# Patient Record
Sex: Female | Born: 2000 | Race: White | Hispanic: No | Marital: Single | Smoking: Never smoker
Health system: Southern US, Community
[De-identification: ages and names within clinical notes are randomized; demographics above are authoritative.]

---

## 2018-02-23 ENCOUNTER — Other Ambulatory Visit: Payer: Self-pay

## 2018-02-23 ENCOUNTER — Encounter: Payer: Self-pay | Admitting: *Deleted

## 2018-02-23 ENCOUNTER — Emergency Department
Admission: EM | Admit: 2018-02-23 | Discharge: 2018-02-23 | Disposition: A | Discharge status: Home / Self Care | Payer: Managed Care, Other (non HMO) | Attending: Emergency Medicine | Admitting: Emergency Medicine

## 2018-02-23 ENCOUNTER — Emergency Department: Payer: Managed Care, Other (non HMO)

## 2018-02-23 DIAGNOSIS — N200 Calculus of kidney: Secondary | ICD-10-CM | POA: Diagnosis not present

## 2018-02-23 DIAGNOSIS — M545 Low back pain: Secondary | ICD-10-CM | POA: Diagnosis present

## 2018-02-23 LAB — URINALYSIS, COMPLETE (UACMP) WITH MICROSCOPIC
BILIRUBIN URINE: NEGATIVE
Bacteria, UA: NONE SEEN
Glucose, UA: NEGATIVE mg/dL
Ketones, ur: 5 mg/dL — AB
LEUKOCYTES UA: NEGATIVE
Nitrite: NEGATIVE
PH: 5 (ref 5.0–8.0)
Protein, ur: 30 mg/dL — AB
Specific Gravity, Urine: 1.024 (ref 1.005–1.030)

## 2018-02-23 LAB — COMPREHENSIVE METABOLIC PANEL
ALBUMIN: 4.1 g/dL (ref 3.5–5.0)
ALK PHOS: 53 U/L (ref 38–126)
ALT: 14 U/L (ref 14–54)
AST: 25 U/L (ref 15–41)
Anion gap: 9 (ref 5–15)
BILIRUBIN TOTAL: 0.4 mg/dL (ref 0.3–1.2)
BUN: 11 mg/dL (ref 6–20)
CO2: 22 mmol/L (ref 22–32)
CREATININE: 0.83 mg/dL (ref 0.44–1.00)
Calcium: 9.3 mg/dL (ref 8.9–10.3)
Chloride: 107 mmol/L (ref 101–111)
GFR calc Af Amer: >60 mL/min (ref >60)
GLUCOSE: 125 mg/dL — AB (ref 65–99)
Potassium: 3.6 mmol/L (ref 3.5–5.1)
Sodium: 138 mmol/L (ref 135–145)
TOTAL PROTEIN: 7.9 g/dL (ref 6.5–8.1)

## 2018-02-23 LAB — CBC
HEMATOCRIT: 38.1 % (ref 35.0–47.0)
HEMOGLOBIN: 12.4 g/dL (ref 12.0–16.0)
MCH: 24.6 pg — ABNORMAL LOW (ref 26.0–34.0)
MCHC: 32.5 g/dL (ref 32.0–36.0)
MCV: 75.7 fL — AB (ref 80.0–100.0)
Platelets: 303 10*3/uL (ref 150–440)
RBC: 5.03 MIL/uL (ref 3.80–5.20)
RDW: 16.4 % — ABNORMAL HIGH (ref 11.5–14.5)
WBC: 9.1 10*3/uL (ref 3.6–11.0)

## 2018-02-23 LAB — LIPASE, BLOOD: LIPASE: 32 U/L (ref 11–51)

## 2018-02-23 LAB — POCT PREGNANCY, URINE: Preg Test, Ur: NEGATIVE

## 2018-02-23 MED ORDER — HYDROCODONE-ACETAMINOPHEN 5-325 MG PO TABS: 1.0000 | ORAL_TABLET | Freq: Four times a day (QID) | ORAL | 0 refills | Status: DC | PRN

## 2018-02-23 MED ORDER — ONDANSETRON 4 MG PO TBDP: 4.0000 mg | ORAL_TABLET | Freq: Four times a day (QID) | ORAL | 0 refills | Status: DC | PRN

## 2018-02-23 MED ORDER — ONDANSETRON HCL 4 MG/2ML IJ SOLN
4.0000 mg | Freq: Once | INTRAMUSCULAR | Status: AC
  Administered 2018-02-23: 4 mg via INTRAVENOUS
  Filled 2018-02-23: qty 2

## 2018-02-23 MED ORDER — MORPHINE SULFATE (PF) 4 MG/ML IV SOLN
4.0000 mg | Freq: Once | INTRAVENOUS | Status: AC
  Administered 2018-02-23: 4 mg via INTRAVENOUS
  Filled 2018-02-23: qty 1

## 2018-02-23 MED ORDER — IBUPROFEN 400 MG PO TABS
600.0000 mg | ORAL_TABLET | ORAL | Status: AC
  Administered 2018-02-23: 600 mg via ORAL
  Filled 2018-02-23: qty 2

## 2018-02-23 NOTE — Discharge Instructions (Signed)
You have been seen in the Emergency Department (ED) today for pain that we believe based on your workup, is caused by kidney stones.  As we have discussed, please drink plenty of fluids.  Please make a follow up appointment with the physician(s) listed elsewhere in this documentation.  You may take pain medication as needed but ONLY as prescribed.    We also recommend that you take over-the-counter ibuprofen regularly according to label instructions over the next 5 days.  Take it with meals to minimize stomach discomfort.  Please see your doctor as soon as possible as stones may take 1-2 weeks to pass and you may require additional care or medications.  Do not drink alcohol, drive or participate in any other potentially dangerous activities while taking opiate pain medication as it may make you sleepy. Do not take this medication with any other sedating medications, either prescription or over-the-counter. If you were prescribed Percocet or Vicodin, do not take these with acetaminophen (Tylenol) as it is already contained within these medications.   This medication is an opiate (or narcotic) pain medication and can be habit forming.  Use it as little as possible to achieve adequate pain control.  Do not use or use it with extreme caution if you have a history of opiate abuse or dependence.  If you are on a pain contract with your primary care doctor or a pain specialist, be sure to let them know you were prescribed this medication today from the [facility].  This medication is intended for your use only - do not give any to anyone else and keep it in a secure place where nobody else, especially children, have access to it.  It will also cause or worsen constipation, so you may want to consider taking an over-the-counter stool softener while you are taking this medication.  Return to the Emergency Department (ED) or call your doctor if you have any worsening pain, fever, painful  urination, are unable to urinate, or develop other symptoms that concern you.

## 2018-02-23 NOTE — ED Notes (Signed)
poct pregnancy Negative 

## 2018-02-23 NOTE — ED Notes (Signed)
Patient transported to CT 

## 2018-02-23 NOTE — ED Triage Notes (Signed)
Pt has right lower back pain x 1 hour.  Pt reports vomiting x 1.  No known injury to back.  Pt alert.

## 2018-02-23 NOTE — ED Provider Notes (Signed)
[facility] Emergency Department Provider Note   ____________________________________________   First MD Initiated Contact with Patient 02/23/18 2052     (approximate)  I have reviewed the triage vital signs and the nursing notes.   HISTORY  Chief Complaint Back Pain and Emesis    HPI Carrie Wiggins is a 17 y.o. female reports that about 1 hour prior to coming to the ER she was with friends at the dining hall when she suddenly began experiencing a sharp very stabbing pain in her right mid back and flank.  She reports that it was painful enough to make her begin vomiting.  She vomited once.  Since then she had a persistent sharp pain in her right mid back.  Not associated with any other symptoms.  Reports her abdomen does not really hurt.  Denies pain in the lower pelvis.  She has not noticed any trouble urinating, no dysuria.  No blood in her urine.  She denies pregnancy.  Reports currently on her normal cycle.  No fevers or chills.  No chest pain.  No shortness of breath.  Reports a moderate to severe pain sharp in the right flank.  No falls or injury.  No past medical history on file.  There are no active problems to display for this patient.  Patient reports she has no notable medical history   Prior to Admission medications   Medication Sig Start Date End Date Taking? Authorizing Provider  HYDROcodone-acetaminophen (NORCO/VICODIN) 5-325 MG tablet Take 1 tablet by mouth every 6 (six) hours as needed for moderate pain. 02/23/18   Sharyn CreamerQuale, Valaria Kohut, MD  ondansetron (ZOFRAN ODT) 4 MG disintegrating tablet Take 1 tablet (4 mg total) by mouth every 6 (six) hours as needed for nausea or vomiting. 02/23/18   Sharyn CreamerQuale, Rahsaan Weakland, MD  Patient reports she takes no medications  Allergies Patient has no known allergies.  No family history on file.  Social History Social History   Tobacco Use  . Smoking status: Never Smoker  . Smokeless tobacco: Never Used    Substance Use Topics  . Alcohol use: Never    Frequency: Never  . Drug use: Never    Review of Systems Constitutional: No fever/chills Eyes: No visual changes. ENT: No sore throat. Cardiovascular: Denies chest pain. Respiratory: Denies shortness of breath. Gastrointestinal: No diarrhea.  No constipation. Genitourinary: Negative for dysuria. Musculoskeletal: Negative for back pain except in her right flank. Skin: Negative for rash. Neurological: Negative for headaches, focal weakness or numbness.    ____________________________________________   PHYSICAL EXAM:  VITAL SIGNS: ED Triage Vitals  Enc Vitals Group     BP --      Pulse --      Resp --      Temp --      Temp src --      SpO2 --      Weight 02/23/18 1958 140 lb (63.5 kg)     Height 02/23/18 1958 5\' 4"  (1.626 m)     Head Circumference --      Peak Flow --      Pain Score 02/23/18 1957 7     Pain Loc --      Pain Edu? --      Excl. in GC? --     Constitutional: Alert and oriented.  Sitting up, appears uncomfortable.  She reports it seems hard to get comfortable and she has pain in her right flank.  She does appear in at least moderate pain.  Eyes: Conjunctivae are normal. Head: Atraumatic. Nose: No congestion/rhinnorhea. Mouth/Throat: Mucous membranes are moist. Neck: No stridor.   Cardiovascular: Normal rate, regular rhythm. Grossly normal heart sounds.  Good peripheral circulation. Respiratory: Normal respiratory effort.  No retractions. Lungs CTAB. Gastrointestinal: Soft and nontender. No distention. Musculoskeletal: No lower extremity tenderness nor edema. Neurologic:  Normal speech and language. No gross focal neurologic deficits are appreciated.  Skin:  Skin is warm, dry and intact. No rash noted. Psychiatric: Mood and affect are normal. Speech and behavior are normal.  ____________________________________________   LABS (all labs ordered are listed, but only abnormal results are  displayed)  Labs Reviewed  URINALYSIS, COMPLETE (UACMP) WITH MICROSCOPIC - Abnormal; Notable for the following components:      Result Value   Color, Urine YELLOW (*)    APPearance HAZY (*)    Hgb urine dipstick LARGE (*)    Ketones, ur 5 (*)    Protein, ur 30 (*)    Squamous Epithelial / LPF 0-5 (*)    All other components within normal limits  CBC - Abnormal; Notable for the following components:   MCV 75.7 (*)    MCH 24.6 (*)    RDW 16.4 (*)    All other components within normal limits  COMPREHENSIVE METABOLIC PANEL - Abnormal; Notable for the following components:   Glucose, Bld 125 (*)    All other components within normal limits  LIPASE, BLOOD  POC URINE PREG, ED  POCT PREGNANCY, URINE   ____________________________________________  EKG   ____________________________________________  RADIOLOGY    CT scan reviewed, proximal right ureteral calculus.  Moderate right-sided hydronephrosis. ____________________________________________   PROCEDURES  Procedure(s) performed: None  Procedures  Critical Care performed: No  ____________________________________________   INITIAL IMPRESSION / ASSESSMENT AND PLAN / ED COURSE  Pertinent labs & imaging results that were available during my care of the patient were reviewed by me and considered in my medical decision making (see chart for details).  Differential diagnosis includes but is not limited to, abdominal perforation, aortic dissection, cholecystitis, appendicitis, diverticulitis, colitis, esophagitis/gastritis, kidney stone, pyelonephritis, urinary tract infection, aortic aneurysm. All are considered in decision and treatment plan. Based upon the patient's presentation and risk factors, sudden onset of right flank pain is very sharp localized to the right costovertebral angle.  She does have some blood in her urine, appears to be a clean sample without signs of infection.  I suspect may be stone disease or renal,  will evaluate with CT without contrast to further evaluate.  Provide pain control.  The location of her pain seems to be inconsistent with acute ovarian torsion, but certainly if CT scan does not demonstrate a good explanation for her flank pain I would definitely entertain obtaining a transvaginal ultrasound to evaluate the ovaries.     ----------------------------------------- 9:42 PM on 02/23/2018 -----------------------------------------  Reevaluate, patient reports her pain is essentially gone now.  She is resting very comfortably.  ----------------------------------------- 10:41 PM on 02/23/2018 -----------------------------------------  CT scan confirms clinical suspicion for kidney stone.  She has a moderate sized stone with hydronephrosis, her creatinine is normal and there is no evidence of infection.  After morphine she ports complete relief of her pain.  She appears appropriate for ongoing outpatient management.  Discussed and described kidney stones, treatment recommendations and need for follow-up with urology with the patient.  She is very understanding.  Her friends were with her will be driving her home.  I will prescribe the patient a narcotic pain medicine  due to their condition which I anticipate will cause at least moderate pain short term. I discussed with the patient safe use of narcotic pain medicines, and that they are not to drive, work in dangerous areas, or ever take more than prescribed (no more than 1 pill every 6 hours). We discussed that this is the type of medication that can be  overdosed on and the risks of this type of medicine. Patient is very agreeable to only use as prescribed and to never use more than prescribed.  Return precautions and treatment recommendations and follow-up discussed with the patient who is agreeable with the plan.  ____________________________________________   FINAL CLINICAL IMPRESSION(S) / ED DIAGNOSES  Final diagnoses:  Kidney  stone on right side      NEW MEDICATIONS STARTED DURING THIS VISIT:  New Prescriptions   HYDROCODONE-ACETAMINOPHEN (NORCO/VICODIN) 5-325 MG TABLET    Take 1 tablet by mouth every 6 (six) hours as needed for moderate pain.   ONDANSETRON (ZOFRAN ODT) 4 MG DISINTEGRATING TABLET    Take 1 tablet (4 mg total) by mouth every 6 (six) hours as needed for nausea or vomiting.     Note:  This document was prepared using Dragon voice recognition software and may include unintentional dictation errors.     Sharyn Creamer, MD 02/23/18 2242

## 2018-02-23 NOTE — ED Notes (Signed)
Patient presents with her college roommate who has the same symptoms. Patient is alert and oriented. Able to answer questions without difficulty. Ambulatory with a steady gait.

## 2018-03-04 NOTE — Progress Notes (Signed)
03/05/2018 5:08 PM   Carrie Wiggins 05-01-2001 [id number]  Referring provider: No referring provider defined for this encounter.  Chief Complaint  Patient presents with  . New Patient (Initial Visit)    HPI: Patient is a 17 year old Caucasian female who is referred by [facility] ED for nephrolithiasis.  On February 23, 2018, she presented to the emergency room with a complaint of sudden onset of a sharp stabbing pain in her right mid back and flank associated with vomiting.  CT Renal stone study on 02/23/2018 demonstrated 2.3 x 2.3 x 5.8 mm proximal right ureteral calculus causing moderate right-sided hydroureteronephrosis.  Small nonobstructing intrarenal calculi on the right measuring up to 3 mm.  Her WBC count was 9.1.  Her creatinine was 0.83.  Her UA was TNTC RBC's and 0-5 WBC's.  She was given Morphine, Zofran and Advil in the ED.  She was discharged with Vicodin 5/325.    Today, she states the pain is in the right flank to right waist.  9/10 pain.  Pain is lasting 1/2 hour to 45 minutes (until the pain medication takes effect)   She has not passed any fragments.  Patient denies any gross hematuria, dysuria or suprapubic/flank pain.  Patient denies any fevers, chills, nausea or vomiting.  Her UA is positive for 6-10 WBC's and > 30 RBC's.    PMH: No past medical history on file.  Surgical History: None  Home Medications:  Allergies as of 03/05/2018   No Known Allergies     Medication List        Accurate as of 03/05/18  5:08 PM. Always use your most recent med list.          HYDROcodone-acetaminophen 5-325 MG tablet Commonly known as:  NORCO/VICODIN Take 1 tablet by mouth every 6 (six) hours as needed for moderate pain.   LARISSIA 0.1-20 MG-MCG tablet Generic drug:  levonorgestrel-ethinyl estradiol Take 1 tablet by mouth daily.   ondansetron 4 MG disintegrating tablet Commonly known as:  ZOFRAN ODT Take 1 tablet (4 mg total) by mouth every 6 (six) hours as needed  for nausea or vomiting.   tamsulosin 0.4 MG Caps capsule Commonly known as:  FLOMAX Take 1 capsule (0.4 mg total) by mouth daily.       Allergies: No Known Allergies  Family History: Family History  Problem Relation Age of Onset  . Kidney cancer Neg Hx   . Kidney disease Neg Hx   . Prostate cancer Neg Hx     Social History:  reports that she has never smoked. She has never used smokeless tobacco. She reports that she does not drink alcohol or use drugs.  ROS: UROLOGY Frequent Urination?: Yes Hard to postpone urination?: No Burning/pain with urination?: No Get up at night to urinate?: No Leakage of urine?: No Urine stream starts and stops?: No Trouble starting stream?: No Do you have to strain to urinate?: No Blood in urine?: No Urinary tract infection?: No Sexually transmitted disease?: No Injury to kidneys or bladder?: No Painful intercourse?: No Weak stream?: No Currently pregnant?: No Vaginal bleeding?: No Last menstrual period?: 02/23/18  Gastrointestinal Nausea?: No Vomiting?: No Indigestion/heartburn?: No Diarrhea?: No Constipation?: No  Constitutional Fever: No Night sweats?: No Weight loss?: No Fatigue?: No  Skin Skin rash/lesions?: No Itching?: No  Eyes Blurred vision?: No Double vision?: No  Ears/Nose/Throat Sore throat?: No Sinus problems?: No  Hematologic/Lymphatic Swollen glands?: No Easy bruising?: No  Cardiovascular Leg swelling?: No Chest pain?: No  Respiratory Cough?: No Shortness of breath?: No  Endocrine Excessive thirst?: No  Musculoskeletal Back pain?: No Joint pain?: No  Neurological Headaches?: No Dizziness?: No  Psychologic Depression?: No Anxiety?: No  Physical Exam: BP 129/78   Pulse 78   Ht 5' 4"  (1.626 m)   Wt 140 lb (63.5 kg)   LMP 02/23/2018 (Exact Date) Comment: neg preg test  BMI 24.03 kg/m   Constitutional: Well nourished. Alert and oriented, No acute distress. HEENT: [place] AT, moist  mucus membranes. Trachea midline, no masses. Cardiovascular: No clubbing, cyanosis, or edema. Respiratory: Normal respiratory effort, no increased work of breathing. Skin: No rashes, bruises or suspicious lesions. Lymph: No cervical or inguinal adenopathy. Neurologic: Grossly intact, no focal deficits, moving all 4 extremities. Psychiatric: Normal mood and affect.  Laboratory Data: Lab Results  Component Value Date   WBC 9.1 02/23/2018   HGB 12.4 02/23/2018   HCT 38.1 02/23/2018   MCV 75.7 (L) 02/23/2018   PLT 303 02/23/2018    Lab Results  Component Value Date   CREATININE 0.83 02/23/2018    No results found for: PSA  No results found for: TESTOSTERONE  No results found for: HGBA1C  No results found for: TSH  No results found for: CHOL, HDL, CHOLHDL, VLDL, LDLCALC  Lab Results  Component Value Date   AST 25 02/23/2018   Lab Results  Component Value Date   ALT 14 02/23/2018   No components found for: ALKALINEPHOPHATASE No components found for: BILIRUBINTOTAL  No results found for: ESTRADIOL  Urinalysis 6-10 WBC's.  > 30 RBC's.  See Epic.    I have reviewed the labs.   Pertinent Imaging: CLINICAL DATA:  Acute right flank pain with hematuria.  EXAM: CT ABDOMEN AND PELVIS WITHOUT CONTRAST  TECHNIQUE: Multidetector CT imaging of the abdomen and pelvis was performed following the standard protocol without IV contrast.  COMPARISON:  None.  FINDINGS: Lower chest: Normal  Hepatobiliary: No focal liver abnormality is seen. No gallstones, gallbladder wall thickening, or biliary dilatation.  Pancreas: Unremarkable. No pancreatic ductal dilatation or surrounding inflammatory changes.  Spleen: Normal in size without focal abnormality.  Adrenals/Urinary Tract: The normal bilateral adrenal glands. Moderate right-sided hydroureteronephrosis secondary to a proximal right ureteral stone measuring 2.3 x 2.3 x 5.8 mm just beyond the right UPJ.  Nonobstructing intrarenal calculi are also noted on the right measuring up to 3 mm. The urinary bladder is decompressed. No focal mural thickening is noted. No bladder calculus is seen.  Stomach/Bowel: Stomach is within normal limits. Appendix appears normal. No evidence of bowel wall thickening, distention, or inflammatory changes.  Vascular/Lymphatic: No significant vascular findings are present. No enlarged abdominal or pelvic lymph nodes.  Reproductive: Uterus and bilateral adnexa are unremarkable.  Other: No abdominal wall hernia or abnormality. No abdominopelvic ascites.  Musculoskeletal: No acute or significant osseous findings.  IMPRESSION: 1. 2.3 x 2.3 x 5.8 mm proximal right ureteral calculus causing moderate right-sided hydroureteronephrosis. 2. Small nonobstructing intrarenal calculi on the right measuring up to 3 mm.   Electronically Signed   By: Ashley Royalty M.D.   On: 02/23/2018 22:22  I have independently reviewed the films.    Assessment & Plan:    1. Right ureteral stone  - explained to the patient that AUA Guidelines for patients with uncomplicated ureteral stones ?10 mm should be offered observation, and those with distal stones of similar size should be offered MET with ?-blockers  - if after 4 to 6 weeks observation with or  without MET is not successful we would pursue URS or ESWL, if the patient/clinician decide to intervene sooner based on a shared decision making approach, the clinicians should offer definitive stone treatment  -URS would be the first lined therapy if stone(s) do not pass, but ESWL is the procedure with the least morbidity and lowest complication rate, but URS has a greater stone-free rate in a single procedure  - patient would like to pursue ESWL at this time as her parents are in town and she will have responsible persons with her  - I explained that ESWL is a means of pulverizing urinary stones without surgery using shockwave  therapy  - I discussed the risks involved with ESWL consist of bruising to the skin and kidney region as a result of the shockwave, possibility of long-term kidney damage, development of high blood pressure and damage to the bowel or long, hematuria, urinary bleeding serious enough to require transfusion or surgical repair or removal of the kidney is rare, rare chance of hematoma formation in the kidney and injuries to the spleen, liver or pancreas.  There is also the risk of urinary tract infection or any infection of the blood system or tissue.   There is also a possibility of resultant damage to female organs.  - I explained that sometimes the stone fragments can stack up in the ureter like coins, a phenomenon described as "Steinstrasse", that would result in a stent placement and/or URS for further treatment  - I informed the patient that IV sedation is typically used on the truck, but it some rare instances we need to use general anesthesia - the risks being infection, irregular heart beat, irregular BP, stroke, MI, CVA, paralysis, coma and/or death.  - given tamsulosin 0.4 mg   2. Right hydronephrosis  - obtain RUS to ensure the hydronephrosis has resolved once she has recovered from ESWL  3. Microscopic hematuria  - UA today demonstrates > 30 RBC's   - continue to monitor the patient's UA after the treatment/passage of the stone to ensure the hematuria has resolved  - if hematuria persists, we will pursue a hematuria workup with CT Urogram and cystoscopy if appropriate.   Return for right ESWL .  These notes generated with voice recognition software. I apologize for typographical errors.  Zara Council, PA-C  [facility] [address] [place], [place] [address] [phone]

## 2018-03-04 NOTE — H&P (View-Only) (Signed)
03/05/2018 5:08 PM   Carrie Wiggins 10/05/2001 [id number]  Referring provider: No referring provider defined for this encounter.  Chief Complaint  Patient presents with  . New Patient (Initial Visit)    HPI: Patient is a 17 year old Caucasian female who is referred by [facility] ED for nephrolithiasis.  On February 23, 2018, she presented to the emergency room with a complaint of sudden onset of a sharp stabbing pain in her right mid back and flank associated with vomiting.  CT Renal stone study on 02/23/2018 demonstrated 2.3 x 2.3 x 5.8 mm proximal right ureteral calculus causing moderate right-sided hydroureteronephrosis.  Small nonobstructing intrarenal calculi on the right measuring up to 3 mm.  Her WBC count was 9.1.  Her creatinine was 0.83.  Her UA was TNTC RBC's and 0-5 WBC's.  She was given Morphine, Zofran and Advil in the ED.  She was discharged with Vicodin 5/325.    Today, she states the pain is in the right flank to right waist.  9/10 pain.  Pain is lasting 1/2 hour to 45 minutes (until the pain medication takes effect)   She has not passed any fragments.  Patient denies any gross hematuria, dysuria or suprapubic/flank pain.  Patient denies any fevers, chills, nausea or vomiting.  Her UA is positive for 6-10 WBC's and > 30 RBC's.    PMH: No past medical history on file.  Surgical History: None  Home Medications:  Allergies as of 03/05/2018   No Known Allergies     Medication List        Accurate as of 03/05/18  5:08 PM. Always use your most recent med list.          HYDROcodone-acetaminophen 5-325 MG tablet Commonly known as:  NORCO/VICODIN Take 1 tablet by mouth every 6 (six) hours as needed for moderate pain.   LARISSIA 0.1-20 MG-MCG tablet Generic drug:  levonorgestrel-ethinyl estradiol Take 1 tablet by mouth daily.   ondansetron 4 MG disintegrating tablet Commonly known as:  ZOFRAN ODT Take 1 tablet (4 mg total) by mouth every 6 (six) hours as needed  for nausea or vomiting.   tamsulosin 0.4 MG Caps capsule Commonly known as:  FLOMAX Take 1 capsule (0.4 mg total) by mouth daily.       Allergies: No Known Allergies  Family History: Family History  Problem Relation Age of Onset  . Kidney cancer Neg Hx   . Kidney disease Neg Hx   . Prostate cancer Neg Hx     Social History:  reports that she has never smoked. She has never used smokeless tobacco. She reports that she does not drink alcohol or use drugs.  ROS: UROLOGY Frequent Urination?: Yes Hard to postpone urination?: No Burning/pain with urination?: No Get up at night to urinate?: No Leakage of urine?: No Urine stream starts and stops?: No Trouble starting stream?: No Do you have to strain to urinate?: No Blood in urine?: No Urinary tract infection?: No Sexually transmitted disease?: No Injury to kidneys or bladder?: No Painful intercourse?: No Weak stream?: No Currently pregnant?: No Vaginal bleeding?: No Last menstrual period?: 02/23/18  Gastrointestinal Nausea?: No Vomiting?: No Indigestion/heartburn?: No Diarrhea?: No Constipation?: No  Constitutional Fever: No Night sweats?: No Weight loss?: No Fatigue?: No  Skin Skin rash/lesions?: No Itching?: No  Eyes Blurred vision?: No Double vision?: No  Ears/Nose/Throat Sore throat?: No Sinus problems?: No  Hematologic/Lymphatic Swollen glands?: No Easy bruising?: No  Cardiovascular Leg swelling?: No Chest pain?: No  Respiratory Cough?: No Shortness of breath?: No  Endocrine Excessive thirst?: No  Musculoskeletal Back pain?: No Joint pain?: No  Neurological Headaches?: No Dizziness?: No  Psychologic Depression?: No Anxiety?: No  Physical Exam: BP 129/78   Pulse 78   Ht 5' 4"  (1.626 m)   Wt 140 lb (63.5 kg)   LMP 02/23/2018 (Exact Date) Comment: neg preg test  BMI 24.03 kg/m   Constitutional: Well nourished. Alert and oriented, No acute distress. HEENT: [place] AT, moist  mucus membranes. Trachea midline, no masses. Cardiovascular: No clubbing, cyanosis, or edema. Respiratory: Normal respiratory effort, no increased work of breathing. Skin: No rashes, bruises or suspicious lesions. Lymph: No cervical or inguinal adenopathy. Neurologic: Grossly intact, no focal deficits, moving all 4 extremities. Psychiatric: Normal mood and affect.  Laboratory Data: Lab Results  Component Value Date   WBC 9.1 02/23/2018   HGB 12.4 02/23/2018   HCT 38.1 02/23/2018   MCV 75.7 (L) 02/23/2018   PLT 303 02/23/2018    Lab Results  Component Value Date   CREATININE 0.83 02/23/2018    No results found for: PSA  No results found for: TESTOSTERONE  No results found for: HGBA1C  No results found for: TSH  No results found for: CHOL, HDL, CHOLHDL, VLDL, LDLCALC  Lab Results  Component Value Date   AST 25 02/23/2018   Lab Results  Component Value Date   ALT 14 02/23/2018   No components found for: ALKALINEPHOPHATASE No components found for: BILIRUBINTOTAL  No results found for: ESTRADIOL  Urinalysis 6-10 WBC's.  > 30 RBC's.  See Epic.    I have reviewed the labs.   Pertinent Imaging: CLINICAL DATA:  Acute right flank pain with hematuria.  EXAM: CT ABDOMEN AND PELVIS WITHOUT CONTRAST  TECHNIQUE: Multidetector CT imaging of the abdomen and pelvis was performed following the standard protocol without IV contrast.  COMPARISON:  None.  FINDINGS: Lower chest: Normal  Hepatobiliary: No focal liver abnormality is seen. No gallstones, gallbladder wall thickening, or biliary dilatation.  Pancreas: Unremarkable. No pancreatic ductal dilatation or surrounding inflammatory changes.  Spleen: Normal in size without focal abnormality.  Adrenals/Urinary Tract: The normal bilateral adrenal glands. Moderate right-sided hydroureteronephrosis secondary to a proximal right ureteral stone measuring 2.3 x 2.3 x 5.8 mm just beyond the right UPJ.  Nonobstructing intrarenal calculi are also noted on the right measuring up to 3 mm. The urinary bladder is decompressed. No focal mural thickening is noted. No bladder calculus is seen.  Stomach/Bowel: Stomach is within normal limits. Appendix appears normal. No evidence of bowel wall thickening, distention, or inflammatory changes.  Vascular/Lymphatic: No significant vascular findings are present. No enlarged abdominal or pelvic lymph nodes.  Reproductive: Uterus and bilateral adnexa are unremarkable.  Other: No abdominal wall hernia or abnormality. No abdominopelvic ascites.  Musculoskeletal: No acute or significant osseous findings.  IMPRESSION: 1. 2.3 x 2.3 x 5.8 mm proximal right ureteral calculus causing moderate right-sided hydroureteronephrosis. 2. Small nonobstructing intrarenal calculi on the right measuring up to 3 mm.   Electronically Signed   By: Ashley Royalty M.D.   On: 02/23/2018 22:22  I have independently reviewed the films.    Assessment & Plan:    1. Right ureteral stone  - explained to the patient that AUA Guidelines for patients with uncomplicated ureteral stones ?10 mm should be offered observation, and those with distal stones of similar size should be offered MET with ?-blockers  - if after 4 to 6 weeks observation with or  without MET is not successful we would pursue URS or ESWL, if the patient/clinician decide to intervene sooner based on a shared decision making approach, the clinicians should offer definitive stone treatment  -URS would be the first lined therapy if stone(s) do not pass, but ESWL is the procedure with the least morbidity and lowest complication rate, but URS has a greater stone-free rate in a single procedure  - patient would like to pursue ESWL at this time as her parents are in town and she will have responsible persons with her  - I explained that ESWL is a means of pulverizing urinary stones without surgery using shockwave  therapy  - I discussed the risks involved with ESWL consist of bruising to the skin and kidney region as a result of the shockwave, possibility of long-term kidney damage, development of high blood pressure and damage to the bowel or long, hematuria, urinary bleeding serious enough to require transfusion or surgical repair or removal of the kidney is rare, rare chance of hematoma formation in the kidney and injuries to the spleen, liver or pancreas.  There is also the risk of urinary tract infection or any infection of the blood system or tissue.   There is also a possibility of resultant damage to female organs.  - I explained that sometimes the stone fragments can stack up in the ureter like coins, a phenomenon described as "Steinstrasse", that would result in a stent placement and/or URS for further treatment  - I informed the patient that IV sedation is typically used on the truck, but it some rare instances we need to use general anesthesia - the risks being infection, irregular heart beat, irregular BP, stroke, MI, CVA, paralysis, coma and/or death.  - given tamsulosin 0.4 mg   2. Right hydronephrosis  - obtain RUS to ensure the hydronephrosis has resolved once she has recovered from ESWL  3. Microscopic hematuria  - UA today demonstrates > 30 RBC's   - continue to monitor the patient's UA after the treatment/passage of the stone to ensure the hematuria has resolved  - if hematuria persists, we will pursue a hematuria workup with CT Urogram and cystoscopy if appropriate.   Return for right ESWL .  These notes generated with voice recognition software. I apologize for typographical errors.  Zara Council, PA-C  [facility] [address] [place], [place] [address] [phone]

## 2018-03-05 ENCOUNTER — Telehealth: Payer: Self-pay | Admitting: Urology

## 2018-03-05 ENCOUNTER — Ambulatory Visit (INDEPENDENT_AMBULATORY_CARE_PROVIDER_SITE_OTHER): Payer: Managed Care, Other (non HMO) | Admitting: Urology

## 2018-03-05 ENCOUNTER — Other Ambulatory Visit: Payer: Self-pay | Admitting: Urology

## 2018-03-05 ENCOUNTER — Encounter: Payer: Self-pay | Admitting: Urology

## 2018-03-05 ENCOUNTER — Ambulatory Visit
Admission: RE | Admit: 2018-03-05 | Discharge: 2018-03-05 | Disposition: A | Discharge status: Home / Self Care | Payer: Managed Care, Other (non HMO) | Source: Ambulatory Visit | Attending: Urology | Admitting: Urology

## 2018-03-05 VITALS — BP 129/78 | HR 78 | Ht 64.0 in | Wt 140.0 lb

## 2018-03-05 DIAGNOSIS — R3129 Other microscopic hematuria: Secondary | ICD-10-CM | POA: Diagnosis not present

## 2018-03-05 DIAGNOSIS — N200 Calculus of kidney: Secondary | ICD-10-CM | POA: Insufficient documentation

## 2018-03-05 DIAGNOSIS — N201 Calculus of ureter: Secondary | ICD-10-CM

## 2018-03-05 DIAGNOSIS — N132 Hydronephrosis with renal and ureteral calculous obstruction: Secondary | ICD-10-CM

## 2018-03-05 LAB — MICROSCOPIC EXAMINATION

## 2018-03-05 LAB — URINALYSIS, COMPLETE
BILIRUBIN UA: NEGATIVE
GLUCOSE, UA: NEGATIVE
KETONES UA: NEGATIVE
Leukocytes, UA: NEGATIVE
NITRITE UA: NEGATIVE
SPEC GRAV UA: 1.02 (ref 1.005–1.030)
UUROB: 1 mg/dL (ref 0.2–1.0)
pH, UA: 8.5 — ABNORMAL HIGH (ref 5.0–7.5)

## 2018-03-05 MED ORDER — TAMSULOSIN HCL 0.4 MG PO CAPS: 0.4000 mg | ORAL_CAPSULE | Freq: Every day | ORAL | 3 refills | Status: DC

## 2018-03-05 NOTE — Telephone Encounter (Signed)
Call patient this afternoon to review her KUB.  I cannot see the stone easilt and is unclear whether it is hidden by bowel gas, occult behind bone, or possibly even that she is passed the stone in the interim.  She does continue to have flank pain and does have persistent blood in her urine so I am suspicious that the stone is in fact retained.  We will plan on bringing her down to the lithotripsy truck in the morning and attempt to find the stone.  If it is outside of the renal shadow, will be able to treat it.  If we cannot locate the stone, will bring her back upstairs without treatment.  She is agreeable with this plan.  All questions were answered.  Vanna ScotlandAshley Rayane Gallardo, MD

## 2018-03-06 ENCOUNTER — Telehealth: Payer: Self-pay | Admitting: Urology

## 2018-03-06 ENCOUNTER — Encounter
Admission: RE | Disposition: A | Discharge status: Home / Self Care | Payer: Self-pay | Source: Ambulatory Visit | Attending: Urology

## 2018-03-06 ENCOUNTER — Ambulatory Visit: Payer: Managed Care, Other (non HMO)

## 2018-03-06 ENCOUNTER — Ambulatory Visit
Admission: RE | Admit: 2018-03-06 | Discharge: 2018-03-06 | Disposition: A | Discharge status: Home / Self Care | Payer: Managed Care, Other (non HMO) | Source: Ambulatory Visit | Attending: Urology | Admitting: Urology

## 2018-03-06 ENCOUNTER — Encounter: Payer: Self-pay | Admitting: *Deleted

## 2018-03-06 ENCOUNTER — Other Ambulatory Visit: Payer: Self-pay

## 2018-03-06 DIAGNOSIS — N201 Calculus of ureter: Secondary | ICD-10-CM

## 2018-03-06 DIAGNOSIS — N132 Hydronephrosis with renal and ureteral calculous obstruction: Secondary | ICD-10-CM | POA: Diagnosis not present

## 2018-03-06 DIAGNOSIS — N200 Calculus of kidney: Secondary | ICD-10-CM

## 2018-03-06 HISTORY — PX: EXTRACORPOREAL SHOCK WAVE LITHOTRIPSY: SHX1557

## 2018-03-06 LAB — POCT PREGNANCY, URINE: PREG TEST UR: NEGATIVE

## 2018-03-06 SURGERY — LITHOTRIPSY, ESWL
Anesthesia: Moderate Sedation | Laterality: Right

## 2018-03-06 MED ORDER — CIPROFLOXACIN HCL 500 MG PO TABS
ORAL_TABLET | ORAL | Status: AC
  Filled 2018-03-06: qty 1

## 2018-03-06 MED ORDER — DIAZEPAM 5 MG PO TABS
ORAL_TABLET | ORAL | Status: AC
  Filled 2018-03-06: qty 2

## 2018-03-06 MED ORDER — DIAZEPAM 5 MG PO TABS
10.0000 mg | ORAL_TABLET | Freq: Once | ORAL | Status: AC
  Administered 2018-03-06: 10 mg via ORAL

## 2018-03-06 MED ORDER — DIPHENHYDRAMINE HCL 25 MG PO CAPS
25.0000 mg | ORAL_CAPSULE | Freq: Once | ORAL | Status: AC
  Administered 2018-03-06: 25 mg via ORAL

## 2018-03-06 MED ORDER — SODIUM CHLORIDE 0.9 % IV SOLN: INTRAVENOUS | Status: DC

## 2018-03-06 MED ORDER — CIPROFLOXACIN HCL 500 MG PO TABS
500.0000 mg | ORAL_TABLET | Freq: Once | ORAL | Status: AC
  Administered 2018-03-06: 500 mg via ORAL

## 2018-03-06 MED ORDER — ONDANSETRON HCL 4 MG/2ML IJ SOLN
INTRAMUSCULAR | Status: AC
  Filled 2018-03-06: qty 2

## 2018-03-06 MED ORDER — ONDANSETRON HCL 4 MG/2ML IJ SOLN
4.0000 mg | Freq: Once | INTRAMUSCULAR | Status: AC
  Administered 2018-03-06: 4 mg via INTRAVENOUS

## 2018-03-06 MED ORDER — DIPHENHYDRAMINE HCL 25 MG PO CAPS
ORAL_CAPSULE | ORAL | Status: AC
  Filled 2018-03-06: qty 1

## 2018-03-06 NOTE — Telephone Encounter (Signed)
Patient had litho today They called to make her a follow up I made her one for 2 weeks with a KUB prior, is that correct? If so I will just need a order for the KUB.  Thanks, Marcelino DusterMichelle

## 2018-03-06 NOTE — Interval H&P Note (Signed)
History and Physical Interval Note:  03/06/2018 7:54 AM  Carrie Wiggins  has presented today for surgery, with the diagnosis of Kidney stone  The various methods of treatment have been discussed with the patient and family. After consideration of risks, benefits and other options for treatment, the patient has consented to  Procedure(s): EXTRACORPOREAL SHOCK WAVE LITHOTRIPSY (ESWL) (Right) as a surgical intervention .  The patient's history has been reviewed, patient examined, no change in status, stable for surgery.  I have reviewed the patient's chart and labs.  Questions were answered to the patient's satisfaction.    RRR CTAB  Vanna ScotlandAshley Trudi Morgenthaler

## 2018-03-06 NOTE — Discharge Instructions (Signed)
See [facility] discharge instructions in chart.  

## 2018-03-08 LAB — CULTURE, URINE COMPREHENSIVE

## 2018-03-20 ENCOUNTER — Ambulatory Visit: Payer: Managed Care, Other (non HMO) | Admitting: Urology

## 2018-04-01 ENCOUNTER — Ambulatory Visit: Payer: Managed Care, Other (non HMO) | Admitting: Urology

## 2018-04-01 ENCOUNTER — Ambulatory Visit
Admission: RE | Admit: 2018-04-01 | Discharge: 2018-04-01 | Disposition: A | Discharge status: Home / Self Care | Payer: Managed Care, Other (non HMO) | Source: Ambulatory Visit | Attending: Urology | Admitting: Urology

## 2018-04-01 ENCOUNTER — Encounter: Payer: Self-pay | Admitting: Urology

## 2018-04-01 ENCOUNTER — Ambulatory Visit (INDEPENDENT_AMBULATORY_CARE_PROVIDER_SITE_OTHER): Payer: Managed Care, Other (non HMO) | Admitting: Urology

## 2018-04-01 VITALS — BP 106/70 | HR 76 | Ht 64.0 in | Wt 140.0 lb

## 2018-04-01 DIAGNOSIS — N202 Calculus of kidney with calculus of ureter: Secondary | ICD-10-CM | POA: Diagnosis present

## 2018-04-01 DIAGNOSIS — N201 Calculus of ureter: Secondary | ICD-10-CM

## 2018-04-01 DIAGNOSIS — N200 Calculus of kidney: Secondary | ICD-10-CM

## 2018-04-08 ENCOUNTER — Telehealth: Payer: Self-pay | Admitting: Urology

## 2018-04-08 ENCOUNTER — Other Ambulatory Visit: Payer: Self-pay | Admitting: Urology

## 2018-04-08 NOTE — Telephone Encounter (Signed)
Please call patient with stone analysis results.  Stone primarily calcium oxalate which is the most common stone type.  Recommendations as previously discussed in the office.  Vanna Scotland, MD

## 2018-04-10 NOTE — Telephone Encounter (Signed)
Patient notified

## 2018-04-22 NOTE — Progress Notes (Signed)
04/01/2018 1:11 PM   Bud Face 13-Jun-2001 [id number]  Referring provider: No referring provider defined for this encounter.  Chief Complaint  Patient presents with  . Post-op Follow-up    litho w/KUB    HPI: 17 year old female with a 6 mm right proximal ureteral calculus who underwent right ESWL on 03/06/2018 who returns today for routine follow-up.  She reports that she had discomfort for a few days and passed some fragments which she brings today.  Since then, he has had no further pain, urinary symptoms, hematuria, or any other concerning signs or symptoms.  She does have an additional 3 mm intrarenal right nonobstructing calculus.  KUB today shows no residual ureteral stone burden.  No previous stone episodes.  Since her last stone episode, she is trying to drink plenty water.   PMH: No past medical history on file.  Surgical History: Past Surgical History:  Procedure Laterality Date  . EXTRACORPOREAL SHOCK WAVE LITHOTRIPSY Right 03/06/2018   Procedure: EXTRACORPOREAL SHOCK WAVE LITHOTRIPSY (ESWL);  Surgeon: Vanna Scotland, MD;  Location: [facility] ORS;  Service: Urology;  Laterality: Right;    Home Medications:  Allergies as of 04/01/2018   No Known Allergies     Medication List        Accurate as of 04/01/18 11:59 PM. Always use your most recent med list.          LARISSIA 0.1-20 MG-MCG tablet Generic drug:  levonorgestrel-ethinyl estradiol Take 1 tablet by mouth daily.       Allergies: No Known Allergies  Family History: Family History  Problem Relation Age of Onset  . Kidney cancer Neg Hx   . Kidney disease Neg Hx   . Prostate cancer Neg Hx     Social History:  reports that she has never smoked. She has never used smokeless tobacco. She reports that she does not drink alcohol or use drugs.  ROS: UROLOGY Frequent Urination?: No Hard to postpone urination?: No Burning/pain with urination?: No Get up at night to urinate?: No Leakage of  urine?: No Urine stream starts and stops?: No Trouble starting stream?: No Do you have to strain to urinate?: No Blood in urine?: No Urinary tract infection?: No Sexually transmitted disease?: No Injury to kidneys or bladder?: No Painful intercourse?: No Weak stream?: No Currently pregnant?: No Vaginal bleeding?: No Last menstrual period?: n  Gastrointestinal Nausea?: No Vomiting?: No Indigestion/heartburn?: No Diarrhea?: No Constipation?: No  Constitutional Fever: No Night sweats?: No Weight loss?: No Fatigue?: No  Skin Skin rash/lesions?: No Itching?: No  Eyes Blurred vision?: No Double vision?: No  Ears/Nose/Throat Sore throat?: No Sinus problems?: No  Hematologic/Lymphatic Swollen glands?: No Easy bruising?: No  Cardiovascular Leg swelling?: No Chest pain?: No  Respiratory Cough?: No Shortness of breath?: No  Endocrine Excessive thirst?: No  Musculoskeletal Back pain?: No Joint pain?: No  Neurological Headaches?: No Dizziness?: No  Psychologic Depression?: No Anxiety?: No  Physical Exam: BP 106/70   Pulse 76   Ht  (1.626 m)   Wt 140 lb (63.5 kg)   BMI 24.03 kg/m   Constitutional:  Alert and oriented, No acute distress. HEENT: [place] AT, moist mucus membranes.  Trachea midline, no masses. Cardiovascular: No clubbing, cyanosis, or edema. Respiratory: Normal respiratory effort, no increased work of breathing. GU: No CVA tenderness Skin: No rashes, bruises or suspicious lesions. Neurologic: Grossly intact, no focal deficits, moving all 4 extremities. Psychiatric: Normal mood and affect.  Laboratory Data: Lab Results  Component Value Date  WBC 9.1 02/23/2018   HGB 12.4 02/23/2018   HCT 38.1 02/23/2018   MCV 75.7 (L) 02/23/2018   PLT 303 02/23/2018    Lab Results  Component Value Date   CREATININE 0.83 02/23/2018    Urinalysis N/a  Pertinent Imaging: Results for orders placed during the hospital encounter of 04/01/18   DG Abd 1 View   Narrative CLINICAL DATA:  Status post right-sided lithotripsy on 06 March 2018. Follow-up study.  EXAM: ABDOMEN - 1 VIEW  COMPARISON:  KUB of March 06, 2018.  FINDINGS: There is a 1.5 mm calcification projecting over the mid-upper pole of the right kidney which appears stable. No left-sided kidney stones are observed. No ureteral or bladder stones are demonstrated. The bowel gas pattern is unremarkable.  IMPRESSION: Stable tiny mid upper pole right-sided kidney stone. No definite stones elsewhere.   Electronically Signed   By: David  [place] M.D.   On: 04/02/2018 07:59    KUB personally reviewed today with the patient.   Assessment & Plan:    1. Right ureteral stone Status post successful right ESWL No evidence of residual stone burden and is currently asymptomatic Stone fragments in today for stone analysis, will call with result We discussed general stone prevention techniques including drinking plenty water with goal of producing 2.5 L urine daily, increased citric acid intake, avoidance of high oxalate containing foods, and decreased salt intake.  Information about dietary recommendations given today. She is interested in pursuing 24-hour urine metabolic work-up given her fairly young age developing kidney stones-will do sometime over the summer and drop off a local [facility] facility  2. Kidney stones Nonobstructing punctate right kidney stone We will follow clinically   Return for Follow-up in the fall with 24 urine results.  Vanna Scotland, MD  [facility] [address] [place], [place] [address] [phone]

## 2018-06-30 ENCOUNTER — Other Ambulatory Visit: Payer: Self-pay | Admitting: Urology

## 2018-08-12 ENCOUNTER — Encounter: Payer: Self-pay | Admitting: Urology

## 2018-08-12 ENCOUNTER — Ambulatory Visit (INDEPENDENT_AMBULATORY_CARE_PROVIDER_SITE_OTHER): Payer: Managed Care, Other (non HMO) | Admitting: Urology

## 2018-08-12 VITALS — BP 133/78 | HR 123 | Ht 64.0 in | Wt 140.0 lb

## 2018-08-12 DIAGNOSIS — N200 Calculus of kidney: Secondary | ICD-10-CM | POA: Diagnosis not present

## 2018-08-12 DIAGNOSIS — R82994 Hypercalciuria: Secondary | ICD-10-CM | POA: Diagnosis not present

## 2018-08-12 NOTE — Progress Notes (Signed)
08/12/2018 2:19 PM   Carrie Wiggins November 12, 2001 [id number]  Referring provider: No referring provider defined for this encounter.  Chief Complaint  Patient presents with  . Results    24 hour urine    HPI: 17 year old female with a personal history of nephrolithiasis who underwent ESWL earlier this year for an obstructing ureteral calculus.  Returns today to review her 24-hour urine metabolic evaluation.  She does have an additional 3 mm intrarenal right nonobstructing calculus.  Stone was not appreciated on her follow-up KUB.  24 urine metabolic work-up shows excellent urinary volume of 2.82 L/day.  Her urinary calcium slightly elevated to 74 mg/day.  Her oxalate is low, 28 mg/day.  Urinary citrate 562 mg/day.  Urinary pH 6.358.  Urinary uric acid level 0.7Z 0 g/day.   BMP previously unremarkable.  Serum calcium level 9.3.  Stone composition 3% calcium phosphate, 70% calcium oxalate monohydrate, 7% calcium oxalate dihydrate.  Over the summer, she is been trying to increase her water intake.  She has been doing a good job with this as well as with dietary changes.  No recent flank pain or gross hematuria.   PMH: No past medical history on file.  Surgical History: Past Surgical History:  Procedure Laterality Date  . EXTRACORPOREAL SHOCK WAVE LITHOTRIPSY Right 03/06/2018   Procedure: EXTRACORPOREAL SHOCK WAVE LITHOTRIPSY (ESWL);  Surgeon: Vanna ScotlandBrandon, Hassan Blackshire, MD;  Location: [facility] ORS;  Service: Urology;  Laterality: Right;    Home Medications:  Allergies as of 08/12/2018   No Known Allergies     Medication List        Accurate as of 08/12/18  2:19 PM. Always use your most recent med list.          LARISSIA 0.1-20 MG-MCG tablet Generic drug:  levonorgestrel-ethinyl estradiol Take 1 tablet by mouth daily.       Allergies: No Known Allergies  Family History: Family History  Problem Relation Age of Onset  . Kidney cancer Neg Hx   . Kidney disease Neg Hx   .  Prostate cancer Neg Hx     Social History:  reports that she has never smoked. She has never used smokeless tobacco. She reports that she does not drink alcohol or use drugs.  ROS: UROLOGY Frequent Urination?: No Hard to postpone urination?: No Burning/pain with urination?: No Get up at night to urinate?: No Leakage of urine?: No Urine stream starts and stops?: No Trouble starting stream?: No Do you have to strain to urinate?: No Blood in urine?: No Urinary tract infection?: No Sexually transmitted disease?: No Injury to kidneys or bladder?: No Painful intercourse?: No Weak stream?: No Currently pregnant?: No Vaginal bleeding?: No Last menstrual period?: n  Gastrointestinal Nausea?: No Vomiting?: No Indigestion/heartburn?: No Diarrhea?: No Constipation?: No  Constitutional Fever: No Night sweats?: No Weight loss?: No Fatigue?: No  Skin Skin rash/lesions?: No Itching?: No  Eyes Blurred vision?: No Double vision?: No  Ears/Nose/Throat Sore throat?: No Sinus problems?: No  Hematologic/Lymphatic Swollen glands?: No Easy bruising?: No  Cardiovascular Leg swelling?: No Chest pain?: No  Respiratory Cough?: No Shortness of breath?: No  Endocrine Excessive thirst?: No  Musculoskeletal Back pain?: No Joint pain?: No  Neurological Headaches?: No Dizziness?: No  Psychologic Depression?: No Anxiety?: No  Physical Exam: BP 133/78   Pulse (!) 123   Ht 5\' 4"  (1.626 m)   Wt 140 lb (63.5 kg)   BMI 24.03 kg/m   Constitutional:  Alert and oriented, No acute distress. HEENT: [place] AT,  moist mucus membranes.  Trachea midline, no masses. Cardiovascular: No clubbing, cyanosis, or edema. Respiratory: Normal respiratory effort, no increased work of breathing. Skin: No rashes, bruises or suspicious lesions. Neurologic: Grossly intact, no focal deficits, moving all 4 extremities. Psychiatric: Normal mood and affect.  Laboratory Data: Lab Results    Component Value Date   WBC 9.1 02/23/2018   HGB 12.4 02/23/2018   HCT 38.1 02/23/2018   MCV 75.7 (L) 02/23/2018   PLT 303 02/23/2018    Lab Results  Component Value Date   CREATININE 0.83 02/23/2018    Urinalysis    Component Value Date/Time   COLORURINE YELLOW (A) 02/23/2018 2001   APPEARANCEUR Cloudy (A) 03/05/2018 1538   LABSPEC 1.024 02/23/2018 2001   PHURINE 5.0 02/23/2018 2001   GLUCOSEU Negative 03/05/2018 1538   HGBUR LARGE (A) 02/23/2018 2001   BILIRUBINUR Negative 03/05/2018 1538   KETONESUR 5 (A) 02/23/2018 2001   PROTEINUR 1+ (A) 03/05/2018 1538   PROTEINUR 30 (A) 02/23/2018 2001   NITRITE Negative 03/05/2018 1538   NITRITE NEGATIVE 02/23/2018 2001   LEUKOCYTESUR Negative 03/05/2018 1538    Lab Results  Component Value Date   LABMICR See below: 03/05/2018   WBCUA 6-10 (A) 03/05/2018   RBCUA >30R 03/05/2018   LABEPIT 0-10 03/05/2018   BACTERIA Few 03/05/2018    Pertinent Imaging: No interval imaging  Assessment & Plan:    1. Kidney stones Asymptomatic 3 mm right kidney stone Conservative management Return if she develops flank pain or gross hematuria  2. Hypercalcinuria Reviewed stone diet again today as well as stone composition 24-hour urine results were also reviewed at length Excellent urinary volume, encouraged to continue copious water intake Mild hypercalciuria without hypercalcemia Given her age, hesitant to start her on thiazide medication due to concern for hypotension If she develops recurrent stones in the future, may consider this medication but will hold off for the time being She is in agreement with this plan   F/u prn  Vanna Scotland, MD  [facility] [address] [place], [place] [address] [phone]

## 2019-09-17 ENCOUNTER — Other Ambulatory Visit: Payer: Self-pay

## 2019-09-17 DIAGNOSIS — Z20822 Contact with and (suspected) exposure to covid-19: Secondary | ICD-10-CM

## 2019-09-19 LAB — NOVEL CORONAVIRUS, NAA: SARS-CoV-2, NAA: NOT DETECTED

## 2019-10-21 ENCOUNTER — Other Ambulatory Visit: Payer: Self-pay

## 2019-10-21 DIAGNOSIS — Z20822 Contact with and (suspected) exposure to covid-19: Secondary | ICD-10-CM

## 2019-10-22 LAB — NOVEL CORONAVIRUS, NAA: SARS-CoV-2, NAA: NOT DETECTED

## 2019-10-29 ENCOUNTER — Other Ambulatory Visit: Payer: Self-pay

## 2019-10-29 DIAGNOSIS — Z20822 Contact with and (suspected) exposure to covid-19: Secondary | ICD-10-CM

## 2019-11-01 LAB — NOVEL CORONAVIRUS, NAA: SARS-CoV-2, NAA: NOT DETECTED

## 2019-12-26 IMAGING — CR DG ABDOMEN 1V
1 series · 1 of 1 positions shown · non-contrast
Comparison: KUB of 03/05/2018 and CT abdomen pelvis of 02/23/2017

CLINICAL DATA: History of renal calculi

EXAM:
ABDOMEN - 1 VIEW

[abdomen kub]
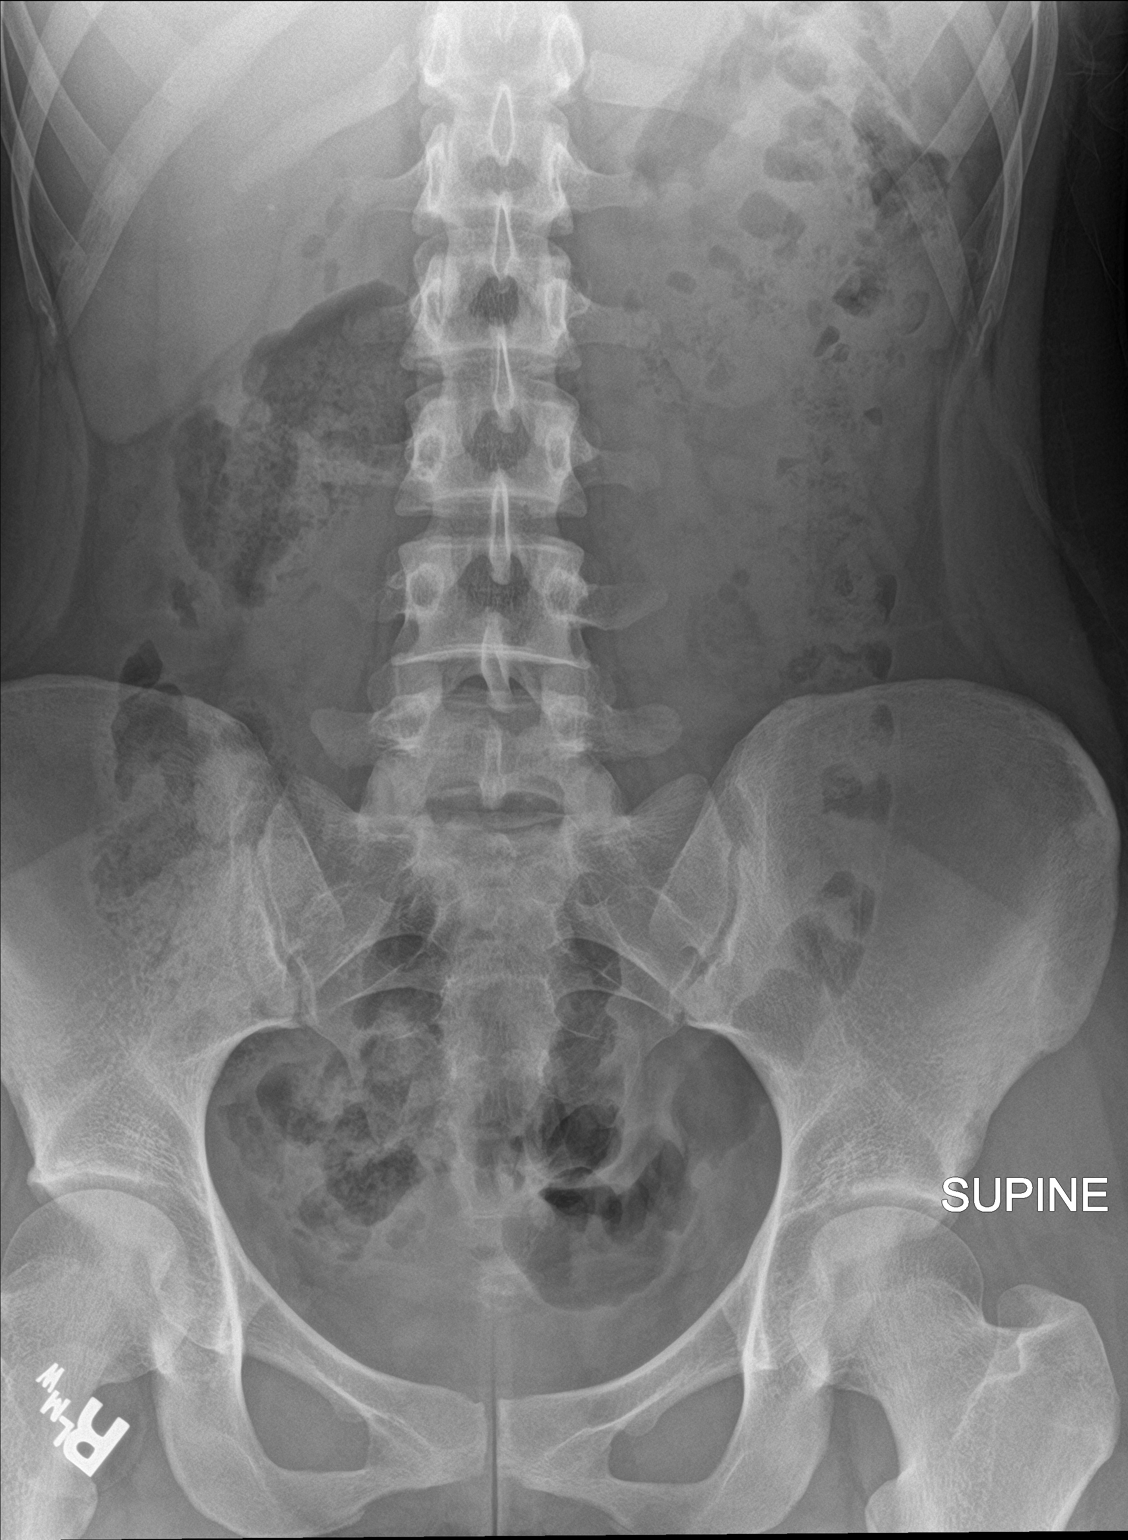

[1 of 1 positions shown; findings below may reference images not displayed]

FINDINGS: There is no change in the small right renal calculus noted
previously. No definite left renal calculi are seen. The proximal
right ureteral calculus noted on recent CT is not seen overlying the
expected courses of the ureters. The bowel gas pattern is
nonspecific. No bony abnormality is seen.
IMPRESSION: 1. No definite ureteral calculus is evident.
2. No change in small right mid renal calculus.
